# Patient Record
Sex: Male | Born: 2005 | Race: Black or African American | Hispanic: No | Marital: Single | Smoking: Former smoker
Health system: Southern US, Community
[De-identification: ages and names within clinical notes are randomized; demographics above are authoritative.]

## PROBLEM LIST (undated history)

## (undated) DIAGNOSIS — F32A Depression, unspecified: Secondary | ICD-10-CM

## (undated) DIAGNOSIS — F259 Schizoaffective disorder, unspecified: Secondary | ICD-10-CM

## (undated) HISTORY — DX: Schizoaffective disorder, unspecified: F25.9

## (undated) HISTORY — DX: Depression, unspecified: F32.A

---
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
## 2024-01-18 ENCOUNTER — Encounter: Payer: MEDICAID | Admitting: Internal Medicine

## 2024-02-08 ENCOUNTER — Ambulatory Visit: Payer: MEDICAID | Admitting: Internal Medicine

## 2024-02-08 ENCOUNTER — Encounter: Payer: Self-pay | Admitting: Internal Medicine

## 2024-02-08 VITALS — BP 118/74 | HR 74 | Resp 16 | Ht 72.0 in | Wt 214.0 lb

## 2024-02-08 DIAGNOSIS — F209 Schizophrenia, unspecified: Secondary | ICD-10-CM

## 2024-02-08 DIAGNOSIS — Z0001 Encounter for general adult medical examination with abnormal findings: Secondary | ICD-10-CM

## 2024-02-08 DIAGNOSIS — E559 Vitamin D deficiency, unspecified: Secondary | ICD-10-CM

## 2024-02-08 DIAGNOSIS — F333 Major depressive disorder, recurrent, severe with psychotic symptoms: Secondary | ICD-10-CM

## 2024-02-08 DIAGNOSIS — Z0000 Encounter for general adult medical examination without abnormal findings: Secondary | ICD-10-CM

## 2024-02-08 DIAGNOSIS — E781 Pure hyperglyceridemia: Secondary | ICD-10-CM | POA: Diagnosis not present

## 2024-02-08 LAB — COMPREHENSIVE METABOLIC PANEL WITH GFR
AG Ratio: 1.6 (calc) (ref 1.0–2.5)
ALT: 51 U/L — ABNORMAL HIGH (ref 9–46)
AST: 33 U/L (ref 10–40)
Albumin: 4.5 g/dL (ref 3.6–5.1)
Alkaline phosphatase (APISO): 100 U/L (ref 36–130)
BUN: 15 mg/dL (ref 7–25)
CO2: 26 mmol/L (ref 20–32)
Calcium: 9.9 mg/dL (ref 8.6–10.3)
Chloride: 104 mmol/L (ref 98–110)
Creat: 1.21 mg/dL (ref 0.60–1.24)
Globulin: 2.9 g/dL (ref 1.9–3.7)
Glucose, Bld: 91 mg/dL (ref 65–99)
Potassium: 4.8 mmol/L (ref 3.5–5.3)
Sodium: 138 mmol/L (ref 135–146)
Total Bilirubin: 0.5 mg/dL (ref 0.2–1.2)
Total Protein: 7.4 g/dL (ref 6.1–8.1)
eGFR: 88 mL/min/1.73m2 (ref >=60)

## 2024-02-08 LAB — CBC WITH DIFFERENTIAL/PLATELET
Absolute Lymphocytes: 1908 {cells}/uL (ref 850–3900)
Absolute Monocytes: 348 {cells}/uL (ref 200–950)
Basophils Absolute: 52 {cells}/uL (ref 0–200)
Basophils Relative: 1.1 %
Eosinophils Absolute: 99 {cells}/uL (ref 15–500)
Eosinophils Relative: 2.1 %
HCT: 49.2 % (ref 38.5–50.0)
Hemoglobin: 16.1 g/dL (ref 13.2–17.1)
MCH: 30.1 pg (ref 27.0–33.0)
MCHC: 32.7 g/dL (ref 32.0–36.0)
MCV: 92 fL (ref 80.0–100.0)
MPV: 11.9 fL (ref 7.5–12.5)
Monocytes Relative: 7.4 %
Neutro Abs: 2294 {cells}/uL (ref 1500–7800)
Neutrophils Relative %: 48.8 %
Platelets: 325 Thousand/uL (ref 140–400)
RBC: 5.35 Million/uL (ref 4.20–5.80)
RDW: 11.7 % (ref 11.0–15.0)
Total Lymphocyte: 40.6 %
WBC: 4.7 Thousand/uL (ref 3.8–10.8)

## 2024-02-08 LAB — LIPID PANEL
Cholesterol: 166 mg/dL (ref <200)
HDL: 51 mg/dL (ref >=40)
LDL Cholesterol (Calc): 100 mg/dL — ABNORMAL HIGH
Non-HDL Cholesterol (Calc): 115 mg/dL (ref <130)
Total CHOL/HDL Ratio: 3.3 (calc) (ref <5.0)
Triglycerides: 67 mg/dL (ref <150)

## 2024-02-08 LAB — VITAMIN D 25 HYDROXY (VIT D DEFICIENCY, FRACTURES): Vit D, 25-Hydroxy: 27 ng/mL — ABNORMAL LOW (ref 30–100)

## 2024-02-08 NOTE — Progress Notes (Signed)
 Established Patient Office Visit  Subjective    Patient ID: William Alexander, male    DOB: Oct 07, 2005  Age: 18 y.o. MRN: [id number]  CC:  Chief Complaint  Patient presents with   Annual Exam    HPI William Alexander presents for follow up on chronic medical conditions.  He is here with his mother.  Discussed the use of AI scribe software for clinical note transcription with the patient, who gave verbal consent to proceed.  History of Present Illness William Alexander is a 18 year old male who presents for a follow-up visit.   He is currently taking olanzapine 20 mg, which is effective. He has access to a psychiatrist and a 24-hour psychiatric support team that visits his home. His behavior has calmed compared to the past.  He has experienced difficulties with Social Security paperwork due to missed communications while in [place] last year. His mother faced challenges with verification questions two weeks ago.  He needs to find a new dental provider after aging out of his pediatric dentist.  Schizophrenia and MDD: -Mood status: better -Current treatment: Now on Zyprexa 20 mg, had been on Celexa  20 mg, Risperdal  1 mg but these were discontinued.  Psychotherapy/counseling: yes current     02/08/2024   10:09 AM 05/18/2022   10:03 AM 10/13/2021   10:18 AM  Depression screen PHQ 2/9  Decreased Interest 0 0 3  Down, Depressed, Hopeless 0 0 2  PHQ - 2 Score 0 0 5  Altered sleeping  0 2  Tired, decreased energy  0 3  Change in appetite  0 0  Feeling bad or failure about yourself   0 0  Trouble concentrating  0 0  Moving slowly or fidgety/restless  0 3  Suicidal thoughts  0 0  PHQ-9 Score  0 13  Difficult doing work/chores  Not difficult at all Very difficult   Hypertriglyceridemia: -Previous lipid panel from June 2023 showing slightly elevated triglycerides for his age at 7, however he was on the Risperdal  at the time, last years was improved.  Lipid Panel     Component Value  Date/Time   CHOL 136 05/18/2022 1031   TRIG 57 05/18/2022 1031   HDL 46 05/18/2022 1031   CHOLHDL 3.0 05/18/2022 1031   VLDL 12 05/29/2021 1746   LDLCALC 77 05/18/2022 1031   Seasonal Allergies: -No issues right now, not taking anything  Health Maintenance: -Blood work due -Vaccinations up-to-date, declines flu vaccine  Outpatient Encounter Medications as of 02/08/2024  Medication Sig   OLANZapine (ZYPREXA) 20 MG tablet Take 20 mg by mouth at bedtime.   [DISCONTINUED] citalopram  (CELEXA ) 10 MG tablet Take 1 tablet (10 mg total) by mouth daily.   [DISCONTINUED] citalopram  (CELEXA ) 20 MG tablet Take 20 mg by mouth daily.   [DISCONTINUED] risperiDONE  (RISPERDAL ) 1 MG tablet Take 1 tablet (1 mg total) by mouth daily.   [DISCONTINUED] traZODone  (DESYREL ) 150 MG tablet Take 150 mg by mouth at bedtime.   [DISCONTINUED] Vitamin D , Ergocalciferol , (DRISDOL ) 1.25 MG (50000 UNIT) CAPS capsule Take 1 capsule (50,000 Units total) by mouth every 7 (seven) days. (Patient not taking: Reported on 02/08/2024)   No facility-administered encounter medications on file as of 02/08/2024.    Past Medical History:  Diagnosis Date   Depression    Schizoaffective disorder (HCC)     History reviewed. No pertinent surgical history.  Family History  Problem Relation Age of Onset   Asthma Mother    Depression Mother  Anxiety disorder Mother     Social History   Socioeconomic History   Marital status: Single    Spouse name: Not on file   Number of children: Not on file   Years of education: Not on file   Highest education level: Not on file  Occupational History   Not on file  Tobacco Use   Smoking status: Former    Types: Cigarettes   Smokeless tobacco: Never  Vaping Use   Vaping status: Never Used  Substance and Sexual Activity   Alcohol use: No   Drug use: Never   Sexual activity: Not Currently  Other Topics Concern   Not on file  Social History Narrative   Not on file   Social  Drivers of Health   Financial Resource Strain: Low Risk  (02/08/2024)   Overall Financial Resource Strain (CARDIA)    Difficulty of Paying Living Expenses: Not hard at all  Food Insecurity: No Food Insecurity (02/08/2024)   Hunger Vital Sign    Worried About Running Out of Food in the Last Year: Never true    Ran Out of Food in the Last Year: Never true  Transportation Needs: No Transportation Needs (02/08/2024)   PRAPARE - Administrator, Civil Service (Medical): No    Lack of Transportation (Non-Medical): No  Physical Activity: Insufficiently Active (02/08/2024)   Exercise Vital Sign    Days of Exercise per Week: 2 days    Minutes of Exercise per Session: 60 min  Stress: No Stress Concern Present (02/08/2024)   William Alexander    Feeling of Stress: Not at all  Social Connections: Socially Isolated (02/08/2024)   Social Connection and Isolation Panel    Frequency of Communication with Friends and Family: Three times a week    Frequency of Social Gatherings with Friends and Family: Never    Attends Religious Services: Never    Database administrator or Organizations: No    Attends Banker Meetings: Never    Marital Status: Never married  Intimate Partner Violence: Not At Risk (02/08/2024)   Humiliation, Afraid, Rape, and Kick Alexander    Fear of Current or Ex-Partner: No    Emotionally Abused: No    Physically Abused: No    Sexually Abused: No    Review of Systems  All other systems reviewed and are negative.       Objective    BP 118/74   Pulse 74   Resp 16   Ht 6' (1.829 m)   Wt 214 lb (97.1 kg)   SpO2 97%   BMI 29.02 kg/m   Physical Exam Constitutional:      Appearance: Normal appearance.  HENT:     Head: Normocephalic and atraumatic.     Mouth/Throat:     Mouth: Mucous membranes are moist.     Pharynx: Oropharynx is clear.  Eyes:     Extraocular Movements: Extraocular  movements intact.     Conjunctiva/sclera: Conjunctivae normal.     Pupils: Pupils are equal, round, and reactive to light.  Neck:     Comments: No thyromegaly  Cardiovascular:     Rate and Rhythm: Normal rate and regular rhythm.  Pulmonary:     Effort: Pulmonary effort is normal.     Breath sounds: Normal breath sounds.  Musculoskeletal:     Cervical back: No tenderness.     Right lower leg: No edema.     Left  lower leg: No edema.  Lymphadenopathy:     Cervical: No cervical adenopathy.  Skin:    General: Skin is warm and dry.  Neurological:     General: No focal deficit present.     Mental Status: He is alert. Mental status is at baseline.  Psychiatric:        Mood and Affect: Affect is blunt and flat.         Assessment & Plan:   Assessment & Plan Adult Wellness Visit Routine visit with ongoing chronic conditions discussed. Discussed social support and daily activity integration. Declined flu vaccine. - Order routine blood work including cholesterol, glucose, kidney function tests, and vitamin D  levels. - Refer to Child psychotherapist for assistance with Social Security process.  Schizophrenia/MDD: - Following with Psychiatry and doing well on Zyprexa. Social work referral placed to help with paperwork.   Vitamin D  deficiency Vitamin D  deficiency requires monitoring. - Order vitamin D  level as part of routine blood work.  - Comprehensive Metabolic Panel (CMET) - CBC with Differential/Platelet - AMB Referral [facility] Care Management - Vitamin D  (25 hydroxy) - Lipid Profile   Return in about 1 year (around 02/07/2025).   Sharyle Fischer, DO

## 2024-02-11 ENCOUNTER — Ambulatory Visit: Payer: Self-pay | Admitting: Internal Medicine

## 2024-02-11 DIAGNOSIS — E559 Vitamin D deficiency, unspecified: Secondary | ICD-10-CM

## 2024-02-11 MED ORDER — VITAMIN D (ERGOCALCIFEROL) 1.25 MG (50000 UNIT) PO CAPS
50000.0000 [IU] | ORAL_CAPSULE | ORAL | 0 refills | Status: AC
Start: 2024-02-11 — End: ?

## 2025-02-13 ENCOUNTER — Ambulatory Visit: Payer: MEDICAID | Admitting: Internal Medicine
